# Patient Record
Sex: Male | Born: 1965 | Race: Black or African American | Hispanic: No | Marital: Married | Smoking: Never smoker
Health system: Southern US, Community
[De-identification: ages and names within clinical notes are randomized; demographics above are authoritative.]

## PROBLEM LIST (undated history)

## (undated) DIAGNOSIS — Z923 Personal history of irradiation: Secondary | ICD-10-CM

---

## 2008-01-05 ENCOUNTER — Emergency Department (HOSPITAL_COMMUNITY): Admission: EM | Admit: 2008-01-05 | Discharge: 2008-01-05 | Payer: Self-pay | Admitting: *Deleted

## 2009-06-07 ENCOUNTER — Emergency Department (HOSPITAL_COMMUNITY): Admission: EM | Admit: 2009-06-07 | Discharge: 2009-06-07 | Payer: Self-pay | Admitting: Emergency Medicine

## 2010-07-19 LAB — POCT RAPID STREP A (OFFICE): Streptococcus, Group A Screen (Direct): NEGATIVE

## 2010-12-25 ENCOUNTER — Emergency Department (HOSPITAL_COMMUNITY)
Admission: EM | Admit: 2010-12-25 | Discharge: 2010-12-25 | Disposition: A | Discharge status: Home / Self Care | Payer: Managed Care, Other (non HMO) | Attending: Emergency Medicine | Admitting: Emergency Medicine

## 2010-12-25 DIAGNOSIS — N489 Disorder of penis, unspecified: Secondary | ICD-10-CM | POA: Insufficient documentation

## 2010-12-25 DIAGNOSIS — N4829 Other inflammatory disorders of penis: Secondary | ICD-10-CM | POA: Insufficient documentation

## 2012-06-15 ENCOUNTER — Emergency Department (HOSPITAL_COMMUNITY)
Admission: EM | Admit: 2012-06-15 | Discharge: 2012-06-15 | Disposition: A | Discharge status: Home / Self Care | Payer: Managed Care, Other (non HMO) | Attending: Emergency Medicine | Admitting: Emergency Medicine

## 2012-06-15 ENCOUNTER — Encounter (HOSPITAL_COMMUNITY): Payer: Self-pay | Admitting: Emergency Medicine

## 2012-06-15 DIAGNOSIS — Z79899 Other long term (current) drug therapy: Secondary | ICD-10-CM | POA: Insufficient documentation

## 2012-06-15 DIAGNOSIS — R21 Rash and other nonspecific skin eruption: Secondary | ICD-10-CM | POA: Insufficient documentation

## 2012-06-15 MED ORDER — DIPHENHYDRAMINE HCL 25 MG PO CAPS: 25.0000 mg | ORAL_CAPSULE | Freq: Three times a day (TID) | ORAL | Status: DC | PRN

## 2012-06-15 MED ORDER — DIPHENHYDRAMINE HCL 25 MG PO CAPS
25.0000 mg | ORAL_CAPSULE | Freq: Three times a day (TID) | ORAL | Status: DC | PRN
  Administered 2012-06-15: 25 mg via ORAL
  Filled 2012-06-15: qty 1

## 2012-06-15 MED ORDER — HYDROCORTISONE 1 % EX CREA
TOPICAL_CREAM | Freq: Three times a day (TID) | CUTANEOUS | Status: DC
  Administered 2012-06-15: 03:00:00 via TOPICAL
  Filled 2012-06-15: qty 28

## 2012-06-15 NOTE — ED Notes (Signed)
Patient says he initially had lesion on his right leg, and then now it has spread to both legs, arms and his back.  Patientsays he has not taken anything for it.  He has been able to tolerate it at home, however tonight he could not stand the itching anymore. The lesions appear to be purple/red in color some look like bruises others like raised round papules.  Some are healed and have scabbed over.

## 2012-06-15 NOTE — ED Provider Notes (Signed)
History     CSN: [id number]  Arrival date & time 06/15/12  6578   First MD Initiated Contact with Patient 06/15/12 0257      Chief Complaint  Patient presents with  . Rash    (Consider location/radiation/quality/duration/timing/severity/associated sxs/prior treatment) HPI 47 year old male presents to emergency apartment complaining of a thoracic rash. He reports the rash started as a patch on his right leg, and some spread. He has lesions on his back, torso, legs, arms. Patient presents as he has had itching that he cannot control. Has not taken any medicine prior to arrival. No fevers. No new allergens noted. No prior history of same. History reviewed. No pertinent past medical history.  History reviewed. No pertinent past surgical history.  History reviewed. No pertinent family history.  History  Substance Use Topics  . Smoking status: Never Smoker   . Smokeless tobacco: Not on file  . Alcohol Use: 1.2 oz/week    1 Glasses of wine, 1 Shots of liquor per week      Review of Systems  All other systems reviewed and are negative.    Allergies  Review of patient's allergies indicates no known allergies.  Home Medications   Current Outpatient Rx  Name  Route  Sig  Dispense  Refill  . lisinopril (PRINIVIL,ZESTRIL) 10 MG tablet   Oral   Take 10 mg by mouth daily.         . diphenhydrAMINE (BENADRYL) 25 mg capsule   Oral   Take 1 capsule (25 mg total) by mouth every 8 (eight) hours as needed for itching.   30 capsule   0     BP 126/78  Pulse 63  Temp(Src) 97.9 F (36.6 C) (Oral)  Resp 20  SpO2 100%  Physical Exam  Nursing note and vitals reviewed. Constitutional: He appears well-developed and well-nourished. No distress.  Cardiovascular: Normal rate, regular rhythm, normal heart sounds and intact distal pulses.  Exam reveals no gallop and no friction rub.   No murmur heard. Pulmonary/Chest: Effort normal and breath sounds normal. No respiratory  distress. He has no wheezes. He has no rales. He exhibits no tenderness.  Skin: Skin is warm and dry. He is not diaphoretic. No erythema. No pallor.  Patient with diffuse rash to torso, arms. Lesions are raised papules to plaques. Some have excoriated edges. They are dark in color. They are pruritic. Patient indicates lesion on right upper thigh as the initial presentation. They do not have a characteristic Christmas tree distribution., But overall do appear consistent with pityriasis.  Psychiatric: He has a normal mood and affect. His behavior is normal. Judgment and thought content normal.    ED Course  Procedures (including critical care time)  Labs Reviewed - No data to display No results found.   1. Rash and nonspecific skin eruption       MDM  47 year old male with one week history of a pruritic rash. Symptoms are somewhat similar to pityriasis. Patient given followup to dermatology for further evaluation if it does not clear up in the next week. Will treat with topical hydrocortisone and  Benadryl        Olivia Mackie, MD 06/15/12 [phone]

## 2014-09-03 ENCOUNTER — Encounter (HOSPITAL_COMMUNITY): Payer: Self-pay | Admitting: *Deleted

## 2014-09-03 ENCOUNTER — Emergency Department (HOSPITAL_COMMUNITY)
Admission: EM | Admit: 2014-09-03 | Discharge: 2014-09-03 | Disposition: A | Discharge status: Home / Self Care | Payer: Managed Care, Other (non HMO) | Attending: Emergency Medicine | Admitting: Emergency Medicine

## 2014-09-03 DIAGNOSIS — R000 Tachycardia, unspecified: Secondary | ICD-10-CM | POA: Insufficient documentation

## 2014-09-03 DIAGNOSIS — R590 Localized enlarged lymph nodes: Secondary | ICD-10-CM | POA: Insufficient documentation

## 2014-09-03 DIAGNOSIS — L02415 Cutaneous abscess of right lower limb: Secondary | ICD-10-CM | POA: Insufficient documentation

## 2014-09-03 DIAGNOSIS — L0291 Cutaneous abscess, unspecified: Secondary | ICD-10-CM

## 2014-09-03 DIAGNOSIS — Z79899 Other long term (current) drug therapy: Secondary | ICD-10-CM | POA: Insufficient documentation

## 2014-09-03 MED ORDER — DOXYCYCLINE HYCLATE 100 MG PO CAPS: 100.0000 mg | ORAL_CAPSULE | Freq: Two times a day (BID) | ORAL | Status: DC

## 2014-09-03 MED ORDER — NAPROXEN 500 MG PO TABS: 500.0000 mg | ORAL_TABLET | Freq: Two times a day (BID) | ORAL | Status: DC | PRN

## 2014-09-03 MED ORDER — LIDOCAINE-EPINEPHRINE 2 %-1:100000 IJ SOLN
20.0000 mL | Freq: Once | INTRAMUSCULAR | Status: AC
  Administered 2014-09-03: 1 mL
  Filled 2014-09-03: qty 20

## 2014-09-03 MED ORDER — HYDROCODONE-ACETAMINOPHEN 5-325 MG PO TABS: 1.0000 | ORAL_TABLET | Freq: Four times a day (QID) | ORAL | Status: DC | PRN

## 2014-09-03 NOTE — ED Notes (Signed)
Pt reports abscess to right upper thigh area. Denies fever. No redness or swelling noted to area.

## 2014-09-03 NOTE — ED Notes (Signed)
Pharmacy to send

## 2014-09-03 NOTE — ED Provider Notes (Signed)
CSN: [id number]     Arrival date & time 09/03/14  1411 History  This chart was scribed for non-physician practitioner, Allen DerryMercedes Camprubi-Soms, PA-C, working with Gerhard Munchobert Lockwood, MD, by Ronney LionSuzanne Le, ED Scribe. This patient was seen in room TR11C/TR11C and the patient's care was started at [facility]:24 PM.    Chief Complaint  Patient presents with  . Abscess   Patient is a 49 y.o. male presenting with abscess. The history is provided by the patient. No language interpreter was used.  Abscess Location:  Leg Leg abscess location:  R upper leg Abscess quality: draining, induration, painful and redness   Abscess quality: no warmth   Red streaking: no   Duration:  3 days Progression:  Worsening Pain details:    Quality:  Pressure   Severity:  Moderate (only with movement)   Duration:  3 days   Timing:  Intermittent (only with movement)   Progression:  Unchanged Chronicity:  Recurrent Context: skin injury (ingrown hair)   Context: not diabetes and not immunosuppression   Relieved by:  Warm compresses and draining/squeezing Exacerbated by: movement/brushing against other leg. Ineffective treatments:  None tried Associated symptoms: no fever, no nausea and no vomiting   Risk factors: prior abscess     HPI Comments: Devin Price is a 49 y.o. male with no significant PMHx, who presents to the Emergency Department complaining of an abscess to his right upper inner thigh that he noticed 3 days ago. He complains of 7/10 pressure-like pain to the abscess which radiates to his inguinal area on the R, which is states is intermittent and only occurs only when he is moving due to the thigh rubbing against the other leg; there is no pain with sitting still. Patient has used a warm compress which helped with pain and bringing the area "to a head", and some white purulent material drained from the area and provided some relief. Reports associated swelling and erythema to this area. Patient has had one abscess in  the past. He denies a history of DM. He denies warmth to the area, red streaking, fever, chills, visual disturbances, SOB, chest pain, abdominal pain, nausea, vomiting, diarrhea, hematuria, dysuria, penile discharge, testicular pain or swelling, rectal pain, neck pain, back pain, dizziness, lightheadedness, numbness, tingling, weakness, or bowel or bladder incontinence. He has NKDA. He believes this started after an ingrown hair.   History reviewed. No pertinent past medical history. History reviewed. No pertinent past surgical history. History reviewed. No pertinent family history. History  Substance Use Topics  . Smoking status: Never Smoker   . Smokeless tobacco: Not on file  . Alcohol Use: 1.2 oz/week    1 Glasses of wine, 1 Shots of liquor per week    Review of Systems  Constitutional: Negative for fever and chills.  Respiratory: Negative for shortness of breath.   Cardiovascular: Negative for chest pain.  Gastrointestinal: Negative for nausea, vomiting, abdominal pain, diarrhea and rectal pain.  Genitourinary: Negative for dysuria, hematuria, discharge, scrotal swelling and testicular pain.  Musculoskeletal: Negative for myalgias, back pain, arthralgias and neck pain.  Skin: Positive for color change and wound.       +abscess  Allergic/Immunologic: Negative for immunocompromised state.  Neurological: Negative for weakness and numbness.  10 Systems reviewed and are negative for acute change except as noted in the HPI.     Allergies  Review of patient's allergies indicates no known allergies.  Home Medications   Prior to Admission medications   Medication Sig Start  Date End Date Taking? Authorizing Provider  diphenhydrAMINE (BENADRYL) 25 mg capsule Take 1 capsule (25 mg total) by mouth every 8 (eight) hours as needed for itching. Patient not taking: Reported on 09/03/2014 06/15/12   Marisa Severinlga Otter, MD  lisinopril (PRINIVIL,ZESTRIL) 10 MG tablet Take 10 mg by mouth daily.     Historical Provider, MD   BP 143/86 mmHg  Pulse 102  Temp(Src) 98.5 F (36.9 C) (Oral)  Resp 14  SpO2 98% Physical Exam  Constitutional: He is oriented to person, place, and time. Vital signs are normal. He appears well-developed and well-nourished.  Non-toxic appearance. No distress.  Afebrile, nontoxic, NAD  HENT:  Head: Normocephalic and atraumatic.  Mouth/Throat: Mucous membranes are normal.  Eyes: Conjunctivae and EOM are normal. Right eye exhibits no discharge. Left eye exhibits no discharge.  Neck: Normal range of motion. Neck supple.  Cardiovascular: Intact distal pulses.  Tachycardia present.   Slight tachycardia at triage vitals, resolved on exam.  Pulmonary/Chest: Effort normal. No respiratory distress.  Abdominal: Normal appearance. He exhibits no distension.  Musculoskeletal: Normal range of motion.  Lymphadenopathy:       Right: Inguinal adenopathy present.  Mild right sided inguinal LAD.  Neurological: He is alert and oriented to person, place, and time. He has normal strength. No sensory deficit.  Skin: Skin is warm and dry. No rash noted. There is erythema.  ~6 cm area of induration along the right inner thigh adjacent to inguinal fold with mild erythema, but no red streaking. Mild warmth to the indurated area, but no surrounding cellulitis. Central fluctuance noted at the area of an opening at the center of the abscess, which is draining purulent material very poorly.   Psychiatric: He has a normal mood and affect.  Nursing note and vitals reviewed.   ED Course  INCISION AND DRAINAGE Date/Time: 09/03/2014 3:55 PM Performed by: Allen DerryAMPRUBI-SOMS, Lang Zingg Authorized by: Allen DerryAMPRUBI-SOMS, Ardith Test Consent: Verbal consent obtained. Risks and benefits: risks, benefits and alternatives were discussed Consent given by: patient Patient understanding: patient states understanding of the procedure being performed Patient consent: the patient's understanding of the procedure  matches consent given Patient identity confirmed: verbally with patient Type: abscess Body area: lower extremity Location details: right leg Anesthesia: local infiltration Local anesthetic: lidocaine 2% with epinephrine Anesthetic total: 1 ml Patient sedated: no Scalpel size: 11 Needle gauge: 23. Incision type: single straight Complexity: complex Drainage: purulent Drainage amount: moderate Wound treatment: wound left open Packing material: none Patient tolerance: Patient tolerated the procedure well with no immediate complications   (including critical care time)  DIAGNOSTIC STUDIES: Oxygen Saturation is 98% on RA, normal by my interpretation.    COORDINATION OF CARE: 3:28 PM - Discussed treatment plan with pt at bedside which includes local anesthesia, followed by I&D, and pt agreed to plan. Also will Rx antibiotics and f/u with PCP.    Labs Review Labs Reviewed - No data to display  Imaging Review No results found.   EKG Interpretation None      MDM   Final diagnoses:  Abscess    49 y.o. male here with abscess to R inner thigh, some surrounding induration and erythema, small amount of central fluctuance. Will proceed with I&D and place on abx. Pt driving today therefore declines pain meds now.   4:08 PM I&D with moderate amount of purulent drainage, some loculations which were broken up. Discussed pain meds, abx (doxycycline for this wound since it's very close to anogenital area), and f/up with PCP in  2 days. Discussed if he can't see his PCP, to go to [facility] in 2 days for recheck. Discussed warm compresses and wound care. I explained the diagnosis and have given explicit precautions to return to the ER including for any other new or worsening symptoms. The patient understands and accepts the medical plan as it's been dictated and I have answered their questions. Discharge instructions concerning home care and prescriptions have been given. The patient is STABLE and is  discharged to home in good condition.   I personally performed the services described in this documentation, which was scribed in my presence. The recorded information has been reviewed and is accurate.  BP 143/86 mmHg  Pulse 102  Temp(Src) 98.5 F (36.9 C) (Oral)  Resp 14  SpO2 98%  Meds ordered this encounter  Medications  . lidocaine-EPINEPHrine (XYLOCAINE W/EPI) 2 %-1:100000 (with pres) injection 20 mL    Sig:   . HYDROcodone-acetaminophen (NORCO) 5-325 MG per tablet    Sig: Take 1 tablet by mouth every 6 (six) hours as needed for severe pain.    Dispense:  15 tablet    Refill:  0    Order Specific Question:  Supervising Provider    Answer:  MILLER, BRIAN [[id number]]  . naproxen (NAPROSYN) 500 MG tablet    Sig: Take 1 tablet (500 mg total) by mouth 2 (two) times daily as needed for mild pain, moderate pain or headache (TAKE WITH MEALS.).    Dispense:  20 tablet    Refill:  0    Order Specific Question:  Supervising Provider    Answer:  MILLER, BRIAN [[id number]]  . doxycycline (VIBRAMYCIN) 100 MG capsule    Sig: Take 1 capsule (100 mg total) by mouth 2 (two) times daily. One po bid x 7 days    Dispense:  14 capsule    Refill:  0    Order Specific Question:  Supervising Provider    Answer:  Eber Hong [[id number]]     Roshan Salamon Camprubi-Soms, PA-C 09/03/14 1620  Gerhard Munch, MD 09/04/14 2252

## 2014-09-03 NOTE — Discharge Instructions (Signed)
Keep wound clean and dry. Apply warm compresses to affected area throughout the day. Take antibiotic until it is finished and avoid direct sunlight while taking antibiotic. Take naprosyn and norco as directed, as needed for pain but do not drive or operate machinery with pain medication use. [facility] for wound recheck. Monitor area for signs of infection to include, but not limited to: increasing pain, redness, drainage/pus, or swelling. Return to emergency department for emergent changing or worsening symptoms.    Abscess An abscess (boil or furuncle) is an infected area on or under the skin. This area is filled with yellowish-white fluid (pus) and other material (debris). HOME CARE   Only take medicines as told by your doctor.  If you were given antibiotic medicine, take it as directed. Finish the medicine even if you start to feel better.  If gauze is used, follow your doctor's directions for changing the gauze.  To avoid spreading the infection:  Keep your abscess covered with a bandage.  Wash your hands well.  Do not share personal care items, towels, or whirlpools with others.  Avoid skin contact with others.  Keep your skin and clothes clean around the abscess.  Keep all doctor visits as told. GET HELP RIGHT AWAY IF:   You have more pain, puffiness (swelling), or redness in the wound site.  You have more fluid or blood coming from the wound site.  You have muscle aches, chills, or you feel sick.  You have a fever. MAKE SURE YOU:   Understand these instructions.  Will watch your condition.  Will get help right away if you are not doing well or get worse. Document Released: 10/02/2007 Document Revised: 10/15/2011 Document Reviewed: 06/28/2011 [facility] Patient Information 2015 [place], [place]. This information is not intended to replace advice given to you by your health care provider. Make sure you discuss any  questions you have with your health care provider.  Incision and Drainage Incision and drainage is a procedure in which a sac-like structure (cystic structure) is opened and drained. The area to be drained usually contains material such as pus, fluid, or blood.  LET YOUR CAREGIVER KNOW ABOUT:   Allergies to medicine.  Medicines taken, including vitamins, herbs, eyedrops, over-the-counter medicines, and creams.  Use of steroids (by mouth or creams).  Previous problems with anesthetics or numbing medicines.  History of bleeding problems or blood clots.  Previous surgery.  Other health problems, including diabetes and kidney problems.  Possibility of pregnancy, if this applies. RISKS AND COMPLICATIONS  Pain.  Bleeding.  Scarring.  Infection. BEFORE THE PROCEDURE  You may need to have an ultrasound or other imaging tests to see how large or deep your cystic structure is. Blood tests may also be used to determine if you have an infection or how severe the infection is. You may need to have a tetanus shot. PROCEDURE  The affected area is cleaned with a cleaning fluid. The cyst area will then be numbed with a medicine (local anesthetic). A small incision will be made in the cystic structure. A syringe or catheter may be used to drain the contents of the cystic structure, or the contents may be squeezed out. The area will then be flushed with a cleansing solution. After cleansing the area, it is often gently packed with a gauze or another wound dressing. Once it is packed, it will be covered with gauze and tape or some other type of  wound dressing. AFTER THE PROCEDURE   Often, you will be allowed to go home right after the procedure.  You may be given antibiotic medicine to prevent or heal an infection.  If the area was packed with gauze or some other wound dressing, you will likely need to come back in 1 to 2 days to get it removed.  The area should heal in about 14  days. Document Released: 10/09/2000 Document Revised: 10/15/2011 Document Reviewed: 06/10/2011 [facility] Patient Information 2015 [place], [place]. This information is not intended to replace advice given to you by your health care provider. Make sure you discuss any questions you have with your health care provider.

## 2017-03-19 ENCOUNTER — Encounter (HOSPITAL_COMMUNITY): Payer: Self-pay | Admitting: *Deleted

## 2017-03-19 ENCOUNTER — Other Ambulatory Visit: Payer: Self-pay

## 2017-03-19 ENCOUNTER — Emergency Department (HOSPITAL_COMMUNITY)
Admission: EM | Admit: 2017-03-19 | Discharge: 2017-03-19 | Disposition: A | Discharge status: Home / Self Care | Payer: Managed Care, Other (non HMO) | Attending: Emergency Medicine | Admitting: Emergency Medicine

## 2017-03-19 DIAGNOSIS — L03115 Cellulitis of right lower limb: Secondary | ICD-10-CM | POA: Insufficient documentation

## 2017-03-19 DIAGNOSIS — L03119 Cellulitis of unspecified part of limb: Secondary | ICD-10-CM

## 2017-03-19 DIAGNOSIS — Z79899 Other long term (current) drug therapy: Secondary | ICD-10-CM | POA: Insufficient documentation

## 2017-03-19 MED ORDER — CEPHALEXIN 500 MG PO CAPS: 500.0000 mg | ORAL_CAPSULE | Freq: Four times a day (QID) | ORAL | 0 refills | Status: DC

## 2017-03-19 MED ORDER — ACETAMINOPHEN 500 MG PO TABS
1000.0000 mg | ORAL_TABLET | Freq: Once | ORAL | Status: AC
  Administered 2017-03-19: 1000 mg via ORAL
  Filled 2017-03-19: qty 2

## 2017-03-19 NOTE — Discharge Instructions (Signed)
You can take Tylenol or Ibuprofen as directed for pain. You can alternate Tylenol and Ibuprofen every 4 hours. If you take Tylenol at 1pm, then you can take Ibuprofen at 5pm. Then you can take Tylenol again at 9pm.   Take antibiotics as directed. Please take all of your antibiotics until finished.  Follow-up  with your PCP in 24-48 hours. If you do not have a primary care doctor, [facility]   Return to the Emergency Department for any worsening pain, increasing redness/welling, fevers or any other concerns.

## 2017-03-19 NOTE — ED Triage Notes (Signed)
Pt reports rt foot swelling and pain. Pt states that he pulled a piece of skin off and began having symptoms after that. Pt states that this has been going on since Sunday. Pt noted to have redness, heat and swelling extending from his toes up his foot.

## 2017-03-19 NOTE — ED Provider Notes (Signed)
[facility] EMERGENCY DEPARTMENT Provider Note   CSN: [id number] Arrival date & time: 03/19/17  0357     History   Chief Complaint Chief Complaint  Patient presents with  . Foot Pain    HPI Devin Price is a 51 y.o. male who presents with 3 days of right foot pain, swelling and redness. Patient reports that symptoms began days ago after he scratched his foot and picked off a portion of dead skin that was on the dorsal aspect of his foot.  Reports that since then, he has had redness, warmth and swelling noted to the dorsal aspect of the right foot.  He states that he has been able to ambulate without any difficulty.  Patient reports that he has not taken any medications for the symptoms.  He denies any preceding trauma, injury, fall.  He states that the symptoms have not extended up to the right lower extremity.  He denies any fevers, chest pain, difficulty breathing, calf pain, redness or swelling to his leg  The history is provided by the patient.    History reviewed. No pertinent past medical history.  There are no active problems to display for this patient.   History reviewed. No pertinent surgical history.     Home Medications    Prior to Admission medications   Medication Sig Start Date End Date Taking? Authorizing Provider  cephALEXin (KEFLEX) 500 MG capsule Take 1 capsule (500 mg total) by mouth 4 (four) times daily. 03/19/17   Maxwell Caul, PA-C  diphenhydrAMINE (BENADRYL) 25 mg capsule Take 1 capsule (25 mg total) by mouth every 8 (eight) hours as needed for itching. Patient not taking: Reported on 09/03/2014 06/15/12   Marisa Severin, MD  doxycycline (VIBRAMYCIN) 100 MG capsule Take 1 capsule (100 mg total) by mouth 2 (two) times daily. One po bid x 7 days 09/03/14   Street, Amaya, PA-C  HYDROcodone-acetaminophen (NORCO) 5-325 MG per tablet Take 1 tablet by mouth every 6 (six) hours as needed for severe pain. 09/03/14   Street, Mercedes, PA-C    lisinopril (PRINIVIL,ZESTRIL) 10 MG tablet Take 10 mg by mouth daily.    [provider]  naproxen (NAPROSYN) 500 MG tablet Take 1 tablet (500 mg total) by mouth 2 (two) times daily as needed for mild pain, moderate pain or headache (TAKE WITH MEALS.). 09/03/14   Street, Glidden, PA-C    Family History No family history on file.  Social History Social History   Tobacco Use  . Smoking status: Never Smoker  Substance Use Topics  . Alcohol use: Yes    Alcohol/week: 1.2 oz    Types: 1 Glasses of wine, 1 Shots of liquor per week  . Drug use: No     Allergies   Patient has no known allergies.   Review of Systems Review of Systems  Constitutional: Negative for fever.  Respiratory: Negative for shortness of breath.   Cardiovascular: Negative for chest pain and leg swelling.  Musculoskeletal:       Right foot pain  Skin: Positive for color change.     Physical Exam Updated Vital Signs BP 136/84 Comment: Simultaneous filing. User may not have seen previous data.  Pulse 85 Comment: Simultaneous filing. User may not have seen previous data.  Temp 98.7 F (37.1 C) (Oral)   Resp 17   SpO2 94% Comment: Simultaneous filing. User may not have seen previous data.  Physical Exam  Constitutional: He appears well-developed and well-nourished.  Sitting  comfortably on examination table  HENT:  Head: Normocephalic and atraumatic.  Eyes: Conjunctivae and EOM are normal. Right eye exhibits no discharge. Left eye exhibits no discharge. No scleral icterus.  Cardiovascular:  Pulses:      Dorsalis pedis pulses are 2+ on the right side, and 2+ on the left side.  Pulmonary/Chest: Effort normal.  Musculoskeletal:  No tenderness palpation to the right ankle.  Full range of motion of right ankle without any difficulty.  Mild tenderness palpation overlying the dorsal aspect of the right foot.  Full range of motion of toes of right foot without any difficulty.  Bilateral lower extremities  are symmetric in appearance.  No edema noted to either extremity.  No calf tenderness bilaterally.  Neurological: He is alert.  Skin: Skin is warm and dry. Capillary refill takes less than 2 seconds.  RLE is not dusky in appearance or cool to touch. Right dorsal foot has overlying warmth, erythema and induration that stops just at the ankle. The skin to the dorsal right foot is dry and cracked with evidence of excoriations.  Psychiatric: He has a normal mood and affect. His speech is normal and behavior is normal.  Nursing note and vitals reviewed.    ED Treatments / Results  Labs (all labs ordered are listed, but only abnormal results are displayed) Labs Reviewed - No data to display  EKG  EKG Interpretation None       Radiology No results found.  Procedures Procedures (including critical care time)  Medications Ordered in ED Medications  acetaminophen (TYLENOL) tablet 1,000 mg (1,000 mg Oral Given 03/19/17 0659)     Initial Impression / Assessment and Plan / ED Course  I have reviewed the triage vital signs and the nursing notes.  Pertinent labs & imaging results that were available during my care of the patient were reviewed by me and considered in my medical decision making (see chart for details).     51 year old male who presents with 3 days of right foot pain, redness and swelling.  Patient reports his symptoms began after he pulled some dead skin off of his feet and began itching and scratching his feet.  No fevers.  No redness, swelling noted to the bilateral lower extremities.  No calf tenderness, chest pain, difficulty breathing. Patient is afebrile, non-toxic appearing, sitting comfortably on examination table. Vital signs reviewed and stable.  Physical exam is concerning for cellulitis.  Patient has no history of gout is not on any medications that would put him at risk for gout.  History/physical exam are not concerning for DVT, acute arterial embolism.  Suspect  that this is cellulitis secondary from scratching and pulling skin off. Patient with no known drug allergies. Will plan to start on antibiotics for treatment of cellulitis. Provided patient with a list of clinic resources to use if he does not have a PCP. Instructed to call them today to arrange follow-up in the next 24-48 hours. Patient had ample opportunity for questions and discussion. All patient's questions were answered with full understanding. Strict return precautions discussed. Patient expresses understanding and agreement to plan.    Final Clinical Impressions(s) / ED Diagnoses   Final diagnoses:  Cellulitis of foot    ED Discharge Orders        Ordered    cephALEXin (KEFLEX) 500 MG capsule  4 times daily     03/19/17 0704       Maxwell CaulLayden, Lindsey A, PA-C 03/19/17 1744    Palumbo, April,  MD 03/21/17 [id number]

## 2017-03-19 NOTE — ED Notes (Signed)
Patient ambulated independently to restroom.  Gait steady and even.   

## 2017-03-19 NOTE — ED Notes (Signed)
Patient able to ambulate independently  

## 2017-11-27 ENCOUNTER — Ambulatory Visit: Payer: Self-pay

## 2017-11-27 ENCOUNTER — Ambulatory Visit (INDEPENDENT_AMBULATORY_CARE_PROVIDER_SITE_OTHER): Payer: BLUE CROSS/BLUE SHIELD | Admitting: Podiatry

## 2017-11-27 DIAGNOSIS — L603 Nail dystrophy: Secondary | ICD-10-CM | POA: Diagnosis not present

## 2017-11-27 DIAGNOSIS — M79609 Pain in unspecified limb: Secondary | ICD-10-CM

## 2017-11-27 DIAGNOSIS — B351 Tinea unguium: Secondary | ICD-10-CM

## 2017-11-27 DIAGNOSIS — M2041 Other hammer toe(s) (acquired), right foot: Secondary | ICD-10-CM

## 2017-11-27 DIAGNOSIS — M2042 Other hammer toe(s) (acquired), left foot: Principal | ICD-10-CM

## 2017-11-27 MED ORDER — FLUCONAZOLE 150 MG PO TABS: 150.0000 mg | ORAL_TABLET | ORAL | 1 refills | Status: DC

## 2017-11-27 NOTE — Patient Instructions (Signed)
The day after your procedure:  Place 1/4 cup of epsom salts in a quart of warm tap water.  Submerge your foot or feet in the solution and soak for 20 minutes.  This soak should be done twice a day.  Next, remove your foot or feet from solution, blot dry the affected area. Apply ointment and cover if instructed by your doctor.   IF YOUR SKIN BECOMES IRRITATED WHILE USING THESE INSTRUCTIONS, IT IS OKAY TO SWITCH TO  WHITE VINEGAR AND WATER.  As another alternative soak, you may use antibacterial soap and water.  Monitor for any signs/symptoms of infection. Call the office immediately if any occur or go directly to the emergency room. Call with any questions/concerns.    

## 2017-11-27 NOTE — Progress Notes (Signed)
  Subjective:  Patient ID: Devin Price, male    DOB: 08-Jul-1965,  MRN: [id number]  Chief Complaint  Patient presents with  . Nail Problem    bilateral elongated thick discolored toenails   52 y.o. male presents with the above complaint.  Reports thickened toenails to both feet.  For several years.  States that very unsightly.  Denies pain.  Denies prior treatments.  No past medical history on file. No past surgical history on file.  Current Outpatient Medications:  .  cephALEXin (KEFLEX) 500 MG capsule, Take 1 capsule (500 mg total) by mouth 4 (four) times daily., Disp: 28 capsule, Rfl: 0 .  diphenhydrAMINE (BENADRYL) 25 mg capsule, Take 1 capsule (25 mg total) by mouth every 8 (eight) hours as needed for itching. (Patient not taking: Reported on 09/03/2014), Disp: 30 capsule, Rfl: 0 .  doxycycline (VIBRAMYCIN) 100 MG capsule, Take 1 capsule (100 mg total) by mouth 2 (two) times daily. One po bid x 7 days, Disp: 14 capsule, Rfl: 0 .  fluconazole (DIFLUCAN) 150 MG tablet, Take 1 tablet (150 mg total) by mouth once a week., Disp: 6 tablet, Rfl: 1 .  HYDROcodone-acetaminophen (NORCO) 5-325 MG per tablet, Take 1 tablet by mouth every 6 (six) hours as needed for severe pain., Disp: 15 tablet, Rfl: 0 .  lisinopril (PRINIVIL,ZESTRIL) 10 MG tablet, Take 10 mg by mouth daily., Disp: , Rfl:  .  naproxen (NAPROSYN) 500 MG tablet, Take 1 tablet (500 mg total) by mouth 2 (two) times daily as needed for mild pain, moderate pain or headache (TAKE WITH MEALS.)., Disp: 20 tablet, Rfl: 0  No Known Allergies Review of Systems: Negative except as noted in the HPI. Denies N/V/F/Ch. Objective:  There were no vitals filed for this visit. General AA&O x3. Normal mood and affect.  Vascular Dorsalis pedis and posterior tibial pulses  present 2+ bilaterally  Capillary refill normal to all digits. Pedal hair growth normal.  Neurologic Epicritic sensation grossly present.  Dermatologic No open  lesions. Interspaces clear of maceration. Hallux nail dystrophy right great toe. Other toes dystrophic, thickened, black discoloration, transverse ridging.  Orthopedic: MMT 5/5 in dorsiflexion, plantarflexion, inversion, and eversion. Normal joint ROM without pain or crepitus.   Assessment & Plan:  Patient was evaluated and treated and all questions answered.  Onychomycosis -Educated on etiology of nail fungus. -Nail sample taken for microbiology and histology. -Nail avulsed as below.  Lesser nails debrided courtesy -Discussed pharmacologic management with patient.  Will try weekly for econazole until nail culture returns.  Advised the risk and benefits of the medication.  Advised appropriate use the medication  Procedure: Avulsion of Toenail Location: Right 1st toe  Anesthesia: Lidocaine 1% plain; 1.545mL and Marcaine 0.5% plain; 1.435mL, digital block. Skin Prep: Betadine. Dressing: Silvadene; telfa; dry, sterile, compression dressing. Technique: Following skin prep, the toe was exsanguinated and a tourniquet was secured at the base of the toe. The affected nail border was freed, split with a nail splitter, and excised. Chemical matrixectomy was then performed with phenol and irrigated out with alcohol. The tourniquet was then removed and sterile dressing applied. Disposition: Patient tolerated procedure well. Patient to return in 2 weeks for follow-up.   Return in about 5 weeks (around 01/01/2018) for Nail Fungus.

## 2018-01-01 ENCOUNTER — Ambulatory Visit (INDEPENDENT_AMBULATORY_CARE_PROVIDER_SITE_OTHER): Payer: BLUE CROSS/BLUE SHIELD | Admitting: Podiatry

## 2018-01-01 DIAGNOSIS — B351 Tinea unguium: Secondary | ICD-10-CM

## 2018-01-01 DIAGNOSIS — M79609 Pain in unspecified limb: Secondary | ICD-10-CM

## 2018-01-01 DIAGNOSIS — L603 Nail dystrophy: Secondary | ICD-10-CM

## 2018-01-01 MED ORDER — FLUCONAZOLE 150 MG PO TABS: 150.0000 mg | ORAL_TABLET | ORAL | 1 refills | Status: DC

## 2018-01-01 NOTE — Progress Notes (Signed)
  Subjective:  Patient ID: Devin Price, male    DOB: 1965-05-18,  MRN: [id number]  Chief Complaint  Patient presents with  . Nail Problem     5WK FOLLOW UP NAIL FUGUS    52 y.o. male presents with the above complaint. States the nails are doing better.  Review of Systems: Negative except as noted in the HPI. Denies N/V/F/Ch.  No past medical history on file.  Current Outpatient Medications:  .  cephALEXin (KEFLEX) 500 MG capsule, Take 1 capsule (500 mg total) by mouth 4 (four) times daily., Disp: 28 capsule, Rfl: 0 .  diphenhydrAMINE (BENADRYL) 25 mg capsule, Take 1 capsule (25 mg total) by mouth every 8 (eight) hours as needed for itching. (Patient not taking: Reported on 09/03/2014), Disp: 30 capsule, Rfl: 0 .  doxycycline (VIBRAMYCIN) 100 MG capsule, Take 1 capsule (100 mg total) by mouth 2 (two) times daily. One po bid x 7 days, Disp: 14 capsule, Rfl: 0 .  fluconazole (DIFLUCAN) 150 MG tablet, Take 1 tablet (150 mg total) by mouth once a week., Disp: 6 tablet, Rfl: 1 .  HYDROcodone-acetaminophen (NORCO) 5-325 MG per tablet, Take 1 tablet by mouth every 6 (six) hours as needed for severe pain., Disp: 15 tablet, Rfl: 0 .  lisinopril (PRINIVIL,ZESTRIL) 10 MG tablet, Take 10 mg by mouth daily., Disp: , Rfl:  .  naproxen (NAPROSYN) 500 MG tablet, Take 1 tablet (500 mg total) by mouth 2 (two) times daily as needed for mild pain, moderate pain or headache (TAKE WITH MEALS.)., Disp: 20 tablet, Rfl: 0  Social History   Tobacco Use  Smoking Status Never Smoker    No Known Allergies Objective:  There were no vitals filed for this visit. There is no height or weight on file to calculate BMI. Constitutional Well developed. Well nourished.  Vascular Dorsalis pedis pulses palpable bilaterally. Posterior tibial pulses palpable bilaterally. Capillary refill normal to all digits.  No cyanosis or clubbing noted. Pedal hair growth normal.  Neurologic Normal speech. Oriented to person,  place, and time. Epicritic sensation to light touch grossly present bilaterally.  Dermatologic Nails elongated and thickened with some proximal clearing. No open wounds. No skin lesions.  Orthopedic: Normal joint ROM without pain or crepitus bilaterally. No visible deformities. No bony tenderness.   Radiographs: None Assessment:   1. Onychomycosis of great toe   2. Nail dystrophy   3. Pain due to onychomycosis of nail    Plan:  Patient was evaluated and treated and all questions answered.  Onychomycosis -Improving. Refill fluconazole. Advised to monitor for side effects. -Nails gently courtesy debrided. -Discussed waiting for nails to fully grow out for complete resolutio.  Return in about 6 weeks (around 02/12/2018) for nail fungus.

## 2018-02-12 ENCOUNTER — Ambulatory Visit (INDEPENDENT_AMBULATORY_CARE_PROVIDER_SITE_OTHER): Payer: BLUE CROSS/BLUE SHIELD | Admitting: Podiatry

## 2018-02-12 DIAGNOSIS — L603 Nail dystrophy: Secondary | ICD-10-CM | POA: Diagnosis not present

## 2018-02-12 DIAGNOSIS — B351 Tinea unguium: Secondary | ICD-10-CM | POA: Diagnosis not present

## 2018-02-12 MED ORDER — FLUCONAZOLE 150 MG PO TABS: 150.0000 mg | ORAL_TABLET | ORAL | 0 refills | Status: DC

## 2018-05-24 NOTE — Progress Notes (Signed)
  Subjective:  Patient ID: Devin Price, male    DOB: 07-29-1965,  MRN: [id number]  Chief Complaint  Patient presents with  . Nail Problem    f/u on nail fungus. Pt is getting better since his last visit    53 y.o. male presents with the above complaint. States the nails are doing better.  Review of Systems: Negative except as noted in the HPI. Denies N/V/F/Ch.  No past medical history on file.  Current Outpatient Medications:  .  cephALEXin (KEFLEX) 500 MG capsule, Take 1 capsule (500 mg total) by mouth 4 (four) times daily., Disp: 28 capsule, Rfl: 0 .  diphenhydrAMINE (BENADRYL) 25 mg capsule, Take 1 capsule (25 mg total) by mouth every 8 (eight) hours as needed for itching., Disp: 30 capsule, Rfl: 0 .  doxycycline (VIBRAMYCIN) 100 MG capsule, Take 1 capsule (100 mg total) by mouth 2 (two) times daily. One po bid x 7 days, Disp: 14 capsule, Rfl: 0 .  fluconazole (DIFLUCAN) 150 MG tablet, Take 1 tablet (150 mg total) by mouth once a week., Disp: 6 tablet, Rfl: 0 .  HYDROcodone-acetaminophen (NORCO) 5-325 MG per tablet, Take 1 tablet by mouth every 6 (six) hours as needed for severe pain., Disp: 15 tablet, Rfl: 0 .  lisinopril (PRINIVIL,ZESTRIL) 10 MG tablet, Take 10 mg by mouth daily., Disp: , Rfl:  .  naproxen (NAPROSYN) 500 MG tablet, Take 1 tablet (500 mg total) by mouth 2 (two) times daily as needed for mild pain, moderate pain or headache (TAKE WITH MEALS.)., Disp: 20 tablet, Rfl: 0  Social History   Tobacco Use  Smoking Status Never Smoker    No Known Allergies Objective:  There were no vitals filed for this visit. There is no height or weight on file to calculate BMI. Constitutional Well developed. Well nourished.  Vascular Dorsalis pedis pulses palpable bilaterally. Posterior tibial pulses palpable bilaterally. Capillary refill normal to all digits.  No cyanosis or clubbing noted. Pedal hair growth normal.  Neurologic Normal speech. Oriented to person, place, and  time. Epicritic sensation to light touch grossly present bilaterally.  Dermatologic Nails elongated and thickened with some proximal clearing. No open wounds. No skin lesions.  Orthopedic: Normal joint ROM without pain or crepitus bilaterally. No visible deformities. No bony tenderness.   Radiographs: None Assessment:   1. Onychomycosis of great toe   2. Nail dystrophy    Plan:  Patient was evaluated and treated and all questions answered.  Onychomycosis -Improving. Refill fluconazole once more. -Discussed no need for liver monitoring with fluconazole. -F/u should he not be happy with results.  Return if symptoms worsen or fail to improve.

## 2018-11-12 ENCOUNTER — Other Ambulatory Visit: Payer: Self-pay | Admitting: *Deleted

## 2018-11-12 DIAGNOSIS — Z20822 Contact with and (suspected) exposure to covid-19: Secondary | ICD-10-CM

## 2018-11-17 LAB — NOVEL CORONAVIRUS, NAA: SARS-CoV-2, NAA: NOT DETECTED

## 2019-11-07 ENCOUNTER — Emergency Department (HOSPITAL_COMMUNITY): Payer: No Typology Code available for payment source

## 2019-11-07 ENCOUNTER — Emergency Department (HOSPITAL_COMMUNITY)
Admission: EM | Admit: 2019-11-07 | Discharge: 2019-11-07 | Disposition: A | Discharge status: Home / Self Care | Payer: No Typology Code available for payment source | Attending: Emergency Medicine | Admitting: Emergency Medicine

## 2019-11-07 DIAGNOSIS — Y999 Unspecified external cause status: Secondary | ICD-10-CM | POA: Insufficient documentation

## 2019-11-07 DIAGNOSIS — Y939 Activity, unspecified: Secondary | ICD-10-CM | POA: Insufficient documentation

## 2019-11-07 DIAGNOSIS — S0081XA Abrasion of other part of head, initial encounter: Secondary | ICD-10-CM | POA: Insufficient documentation

## 2019-11-07 DIAGNOSIS — Z79899 Other long term (current) drug therapy: Secondary | ICD-10-CM | POA: Insufficient documentation

## 2019-11-07 DIAGNOSIS — S61411A Laceration without foreign body of right hand, initial encounter: Secondary | ICD-10-CM

## 2019-11-07 DIAGNOSIS — Y929 Unspecified place or not applicable: Secondary | ICD-10-CM | POA: Insufficient documentation

## 2019-11-07 DIAGNOSIS — S301XXA Contusion of abdominal wall, initial encounter: Secondary | ICD-10-CM | POA: Diagnosis not present

## 2019-11-07 DIAGNOSIS — I10 Essential (primary) hypertension: Secondary | ICD-10-CM | POA: Insufficient documentation

## 2019-11-07 DIAGNOSIS — S0990XA Unspecified injury of head, initial encounter: Secondary | ICD-10-CM

## 2019-11-07 DIAGNOSIS — S20219A Contusion of unspecified front wall of thorax, initial encounter: Secondary | ICD-10-CM | POA: Insufficient documentation

## 2019-11-07 DIAGNOSIS — S161XXA Strain of muscle, fascia and tendon at neck level, initial encounter: Secondary | ICD-10-CM | POA: Insufficient documentation

## 2019-11-07 DIAGNOSIS — S60921A Unspecified superficial injury of right hand, initial encounter: Secondary | ICD-10-CM | POA: Diagnosis present

## 2019-11-07 LAB — BASIC METABOLIC PANEL
Anion gap: 16 — ABNORMAL HIGH (ref 5–15)
BUN: 15 mg/dL (ref 6–20)
CO2: 17 mmol/L — ABNORMAL LOW (ref 22–32)
Calcium: 8.9 mg/dL (ref 8.9–10.3)
Chloride: 106 mmol/L (ref 98–111)
Creatinine, Ser: 1.33 mg/dL — ABNORMAL HIGH (ref 0.61–1.24)
GFR calc Af Amer: >60 mL/min (ref >60)
GFR calc non Af Amer: >60 mL/min (ref >60)
Glucose, Bld: 117 mg/dL — ABNORMAL HIGH (ref 70–99)
Potassium: 3.2 mmol/L — ABNORMAL LOW (ref 3.5–5.1)
Sodium: 139 mmol/L (ref 135–145)

## 2019-11-07 LAB — CBC WITH DIFFERENTIAL/PLATELET
Abs Immature Granulocytes: 0.03 10*3/uL (ref 0.00–0.07)
Basophils Absolute: 0.1 10*3/uL (ref 0.0–0.1)
Basophils Relative: 0 %
Eosinophils Absolute: 0.1 10*3/uL (ref 0.0–0.5)
Eosinophils Relative: 1 %
HCT: 44.5 % (ref 39.0–52.0)
Hemoglobin: 15 g/dL (ref 13.0–17.0)
Immature Granulocytes: 0 %
Lymphocytes Relative: 47 %
Lymphs Abs: 5.6 10*3/uL — ABNORMAL HIGH (ref 0.7–4.0)
MCH: 31.1 pg (ref 26.0–34.0)
MCHC: 33.7 g/dL (ref 30.0–36.0)
MCV: 92.1 fL (ref 80.0–100.0)
Monocytes Absolute: 1 10*3/uL (ref 0.1–1.0)
Monocytes Relative: 8 %
Neutro Abs: 5.4 10*3/uL (ref 1.7–7.7)
Neutrophils Relative %: 44 %
Platelets: 226 10*3/uL (ref 150–400)
RBC: 4.83 MIL/uL (ref 4.22–5.81)
RDW: 13.1 % (ref 11.5–15.5)
WBC: 12.1 10*3/uL — ABNORMAL HIGH (ref 4.0–10.5)
nRBC: 0 % (ref 0.0–0.2)

## 2019-11-07 LAB — URINALYSIS, ROUTINE W REFLEX MICROSCOPIC
Bilirubin Urine: NEGATIVE
Glucose, UA: NEGATIVE mg/dL
Ketones, ur: NEGATIVE mg/dL
Leukocytes,Ua: NEGATIVE
Nitrite: NEGATIVE
Protein, ur: 30 mg/dL — AB
Specific Gravity, Urine: 1.006 (ref 1.005–1.030)
pH: 5 (ref 5.0–8.0)

## 2019-11-07 LAB — TYPE AND SCREEN
ABO/RH(D): A POS
Antibody Screen: NEGATIVE

## 2019-11-07 LAB — ETHANOL: Alcohol, Ethyl (B): 171 mg/dL — ABNORMAL HIGH (ref <10)

## 2019-11-07 MED ORDER — SODIUM CHLORIDE 0.9 % IV BOLUS
1000.0000 mL | Freq: Once | INTRAVENOUS | Status: AC
  Administered 2019-11-07: 1000 mL via INTRAVENOUS

## 2019-11-07 MED ORDER — HYDROCODONE-ACETAMINOPHEN 5-325 MG PO TABS: 1.0000 | ORAL_TABLET | Freq: Four times a day (QID) | ORAL | 0 refills | Status: AC | PRN

## 2019-11-07 MED ORDER — CEFAZOLIN SODIUM-DEXTROSE 2-4 GM/100ML-% IV SOLN
2.0000 g | Freq: Once | INTRAVENOUS | Status: AC
  Administered 2019-11-07: 2 g via INTRAVENOUS
  Filled 2019-11-07: qty 100

## 2019-11-07 MED ORDER — MORPHINE SULFATE (PF) 4 MG/ML IV SOLN
4.0000 mg | Freq: Once | INTRAVENOUS | Status: AC
  Administered 2019-11-07: 4 mg via INTRAVENOUS
  Filled 2019-11-07: qty 1

## 2019-11-07 MED ORDER — AMOXICILLIN-POT CLAVULANATE 875-125 MG PO TABS: 1.0000 | ORAL_TABLET | Freq: Two times a day (BID) | ORAL | 0 refills | Status: AC

## 2019-11-07 MED ORDER — LIDOCAINE HCL 0.5 % IJ SOLN
40.0000 mL | Freq: Once | INTRAMUSCULAR | Status: DC
  Filled 2019-11-07: qty 40

## 2019-11-07 MED ORDER — IBUPROFEN 200 MG PO TABS
600.0000 mg | ORAL_TABLET | Freq: Four times a day (QID) | ORAL | 0 refills | Status: AC
Start: 2019-11-07 — End: 2019-11-22

## 2019-11-07 MED ORDER — IOHEXOL 300 MG/ML  SOLN
100.0000 mL | Freq: Once | INTRAMUSCULAR | Status: AC | PRN
  Administered 2019-11-07: 100 mL via INTRAVENOUS

## 2019-11-07 MED ORDER — GENTAMICIN SULFATE 40 MG/ML IJ SOLN
2.0000 mg/kg | Freq: Once | INTRAVENOUS | Status: AC
  Administered 2019-11-07: 180 mg via INTRAVENOUS
  Filled 2019-11-07: qty 4.5

## 2019-11-07 NOTE — ED Notes (Signed)
Patient verbalizes understanding of discharge instructions. Opportunity for questioning and answers were provided. Armband removed by staff, pt discharged from ED.  
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
## 2019-11-07 NOTE — Discharge Instructions (Addendum)
RIGHT HAND CARE  MOVE THE FINGERS AS MUCH AS IS POSSIBLE IN THE BANDAGE TO HELP PREVENT STIFFNESS KEEP THE BANDAGE ON, CLEAN AND DRY UNTIL YOUR FIRST FOLLOW UP APPOINTMENT  **MOST IMPORTANT--START THEM RIGHT AWAY & TAKE THE ANTIBIOTICS AS DIRECTED UNTIL THEY ARE GONE
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
## 2019-11-07 NOTE — ED Notes (Signed)
Pt attempted to ambulate and stated he felt dizzy upon standing.

## 2019-11-07 NOTE — ED Notes (Signed)
Pt ambulated to RR without incident or complaint of dizziness. EDP notifed.
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
## 2020-04-29 HISTORY — PX: FINGER TENDON REPAIR: SHX1640
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
## 2023-04-08 ENCOUNTER — Other Ambulatory Visit: Payer: Self-pay

## 2023-04-08 ENCOUNTER — Emergency Department (HOSPITAL_BASED_OUTPATIENT_CLINIC_OR_DEPARTMENT_OTHER): Payer: Managed Care, Other (non HMO) | Admitting: Radiology

## 2023-04-08 ENCOUNTER — Encounter (HOSPITAL_BASED_OUTPATIENT_CLINIC_OR_DEPARTMENT_OTHER): Payer: Self-pay | Admitting: Emergency Medicine

## 2023-04-08 ENCOUNTER — Emergency Department (HOSPITAL_BASED_OUTPATIENT_CLINIC_OR_DEPARTMENT_OTHER)
Admission: EM | Admit: 2023-04-08 | Discharge: 2023-04-08 | Disposition: A | Discharge status: Home / Self Care | Payer: Self-pay | Attending: Emergency Medicine | Admitting: Emergency Medicine

## 2023-04-08 DIAGNOSIS — Y92015 Private garage of single-family (private) house as the place of occurrence of the external cause: Secondary | ICD-10-CM | POA: Insufficient documentation

## 2023-04-08 DIAGNOSIS — S62646A Nondisplaced fracture of proximal phalanx of right little finger, initial encounter for closed fracture: Secondary | ICD-10-CM

## 2023-04-08 DIAGNOSIS — X58XXXA Exposure to other specified factors, initial encounter: Secondary | ICD-10-CM | POA: Insufficient documentation

## 2023-04-08 MED ORDER — NAPROXEN 500 MG PO TABS: 500.0000 mg | ORAL_TABLET | Freq: Two times a day (BID) | ORAL | 0 refills | Status: AC

## 2023-04-08 MED ORDER — NAPROXEN 250 MG PO TABS
500.0000 mg | ORAL_TABLET | Freq: Once | ORAL | Status: AC
  Administered 2023-04-08: 500 mg via ORAL
  Filled 2023-04-08: qty 2

## 2023-04-08 NOTE — Discharge Instructions (Signed)
You were evaluated in the Emergency Department and after careful evaluation, we did not find any emergent condition requiring admission or further testing in the hospital.  Your exam/testing today is overall reassuring.  X-ray revealed a small fracture or broken bone in your finger.  Recommend follow-up with your hand specialist for further management.  Use the Naprosyn twice daily for pain.  Please return to the Emergency Department if you experience any worsening of your condition.   Thank you for allowing [place] to be a part of your care.

## 2023-04-08 NOTE — ED Provider Notes (Signed)
[facility] EMERGENCY [facility] Emergency Department Provider Note MRN:  [id number]  Arrival date & time: 04/08/23     Chief Complaint   Hand Injury   History of Present Illness   Devin Price is a 57 y.o. year-old male with no pertinent past medical history presenting to the ED with chief complaint of hand injury.  Stumbled in the garage and fell onto the ulnar side of his hand to break his fall.  Having persistent pain to this right pinky finger ever since, denies head trauma, no other injuries.  Review of Systems  A thorough review of systems was obtained and all systems are negative except as noted in the HPI and PMH.   Patient's Health History   History reviewed. No pertinent past medical history.  History reviewed. No pertinent surgical history.  History reviewed. No pertinent family history.  Social History   Socioeconomic History   Marital status: Married    Spouse name: Not on file   Number of children: Not on file   Years of education: Not on file   Highest education level: Not on file  Occupational History   Not on file  Tobacco Use   Smoking status: Never   Smokeless tobacco: Not on file  Substance and Sexual Activity   Alcohol use: Yes    Alcohol/week: 2.0 standard drinks of alcohol    Types: 1 Glasses of wine, 1 Shots of liquor per week   Drug use: No   Sexual activity: Not on file  Other Topics Concern   Not on file  Social History Narrative   Not on file   Social Determinants of Health   Financial Resource Strain: Not on file  Food Insecurity: No Food Insecurity (08/03/2020)   Received from [facility]   Hunger Vital Sign    Worried About Running Out of Food in the Last Year: Never true    Ran Out of Food in the Last Year: Never true  Transportation Needs: Not on file  Physical Activity: Not on file  Stress: Not on file  Social Connections: Unknown (09/11/2021)   Received from [facility]   Social Network    Social Network: Not on  file  Intimate Partner Violence: Unknown (08/03/2021)   Received from [facility]   HITS    Physically Hurt: Not on file    Insult or Talk Down To: Not on file    Threaten Physical Harm: Not on file    Scream or Curse: Not on file     Physical Exam   Vitals:   04/08/23 0158  BP: (!) 150/101  Pulse: 90  Resp: 20  Temp: 98.2 F (36.8 C)  SpO2: 98%    CONSTITUTIONAL: Well-appearing, NAD NEURO/PSYCH:  Alert and oriented x 3, no focal deficits EYES:  eyes equal and reactive ENT/NECK:  no LAD, no JVD CARDIO: Regular rate, well-perfused, normal S1 and S2 PULM:  CTAB no wheezing or rhonchi GI/GU:  non-distended, non-tender MSK/SPINE:  No gross deformities, no edema SKIN:  no rash, atraumatic   *Additional and/or pertinent findings included in MDM below  Diagnostic and Interventional Summary    EKG Interpretation Date/Time:    Ventricular Rate:    PR Interval:    QRS Duration:    QT Interval:    QTC Calculation:   R Axis:      Text Interpretation:         Labs Reviewed - No data to display  DG Hand Complete Right  Final Result      Medications  naproxen (NAPROSYN) tablet 500 mg (500 mg Oral Given 04/08/23 0236)     Procedures  /  Critical Care Procedures  ED Course and Medical Decision Making  Initial Impression and Ddx Patient has a chronically deformed right pinky finger that is flexed at the PIP.  This has been present for several years.  His pain is at the base of the finger.  Neurovascularly intact awaiting imaging  Past medical/surgical history that increases complexity of ED encounter: None  Interpretation of Diagnostics I personally reviewed the hand x-ray and my interpretation is as follows: Fracture noted    Patient Reassessment and Ultimate Disposition/Management     Discharge  Patient management required discussion with the following services or consulting groups:  None  Complexity of Problems Addressed Acute complicated illness or  Injury  Additional Data Reviewed and Analyzed Further history obtained from: None  Additional Factors Impacting ED Encounter Risk Prescriptions  Elmer Sow. Pilar Plate, MD [facility] Emergency Medicine [facility] [email or web address]  Final Clinical Impressions(s) / ED Diagnoses     ICD-10-CM   1. Closed nondisplaced fracture of proximal phalanx of right little finger, initial encounter  Z61.096E       ED Discharge Orders          Ordered    naproxen (NAPROSYN) 500 MG tablet  2 times daily        04/08/23 0323             Discharge Instructions Discussed with and Provided to Patient:    Discharge Instructions      You were evaluated in the Emergency Department and after careful evaluation, we did not find any emergent condition requiring admission or further testing in the hospital.  Your exam/testing today is overall reassuring.  X-ray revealed a small fracture or broken bone in your finger.  Recommend follow-up with your hand specialist for further management.  Use the Naprosyn twice daily for pain.  Please return to the Emergency Department if you experience any worsening of your condition.   Thank you for allowing [place] to be a part of your care.      Sabas Sous, MD 04/08/23 [phone]

## 2023-04-08 NOTE — ED Triage Notes (Signed)
Patient presents with right hand pain s/p fall yesterday. States "tried to catch myself and hurt my hand".

## 2023-05-27 ENCOUNTER — Telehealth: Payer: Self-pay | Admitting: Plastic Surgery

## 2023-05-27 NOTE — Telephone Encounter (Signed)
Called pt bc we still do not have the [place] referral, he said he would call and find out why, just an update

## 2023-05-28 ENCOUNTER — Institutional Professional Consult (permissible substitution): Payer: Self-pay | Admitting: Plastic Surgery

## 2023-08-06 ENCOUNTER — Telehealth: Payer: Self-pay | Admitting: Radiation Oncology

## 2023-08-06 NOTE — Telephone Encounter (Signed)
 Called patient to schedule a consultation w. Dr. Mitzi Hansen. No answer, LVM for a return call.

## 2023-08-12 NOTE — Progress Notes (Signed)
 Radiation Oncology         [phone] ________________________________  Initial {In/Out}patient Consultation  Name: Devin Price MRN: [id number]  Date: 08/13/2023  DOB: 28-Mar-1966  WU:[id number], No Pcp Per  Sherie Dine, MD   REFERRING PHYSICIAN: Sherie Dine, MD  DIAGNOSIS: There were no encounter diagnoses.  Recurrent left ear keloid   HISTORY OF PRESENT ILLNESS::Devin Price is a 58 y.o. male who is accompanied by ***. he is seen as a courtesy of Dr. Richard Champion for an opinion concerning radiation therapy as part of management for his recurrent keloid.   The patient presented to his dermatologist with complains of recurrent left ear keloid scar present for 25+ years most likely the result of a piercing he had when he was in the [employer]. He had previously shaved it twice and had several steroid injections with minimum response. Patient then was referred to plastic surgery on 07/23/23 for excision.    Dr. Vallie Gay explained that patient will have to undergo radiation treatment following his excision to minimize recurrence. Patient will decide on treatment plan after radiation consultation.   PREVIOUS RADIATION THERAPY: No  PAST MEDICAL HISTORY: No past medical history on file.  PAST SURGICAL HISTORY:No past surgical history on file.  FAMILY HISTORY: No family history on file.  SOCIAL HISTORY:  Social History   Tobacco Use   Smoking status: Never  Substance Use Topics   Alcohol use: Yes    Alcohol/week: 2.0 standard drinks of alcohol    Types: 1 Glasses of wine, 1 Shots of liquor per week   Drug use: No    ALLERGIES: No Known Allergies  MEDICATIONS:  Current Outpatient Medications  Medication Sig Dispense Refill   HYDROcodone-acetaminophen (NORCO) 5-325 MG tablet Take 1-2 tablets by mouth every 6 (six) hours as needed. 20 tablet 0   naproxen (NAPROSYN) 500 MG tablet Take 1 tablet (500 mg total) by mouth 2 (two) times daily. 30 tablet 0   No current  facility-administered medications for this encounter.    REVIEW OF SYSTEMS:  A 10+ POINT REVIEW OF SYSTEMS WAS OBTAINED including neurology, dermatology, psychiatry, cardiac, respiratory, lymph, extremities, GI, GU, musculoskeletal, constitutional, reproductive, HEENT. ***   PHYSICAL EXAM:  vitals were not taken for this visit.   General: Alert and oriented, in no acute distress HEENT: Head is normocephalic. Extraocular movements are intact. Oropharynx is clear. Neck: Neck is supple, no palpable cervical or supraclavicular lymphadenopathy. Heart: Regular in rate and rhythm with no murmurs, rubs, or gallops. Chest: Clear to auscultation bilaterally, with no rhonchi, wheezes, or rales. Abdomen: Soft, nontender, nondistended, with no rigidity or guarding. Extremities: No cyanosis or edema. Lymphatics: see Neck Exam Skin: No concerning lesions. Musculoskeletal: symmetric strength and muscle tone throughout. Neurologic: Cranial nerves II through XII are grossly intact. No obvious focalities. Speech is fluent. Coordination is intact. Psychiatric: Judgment and insight are intact. Affect is appropriate. ***  ECOG = ***  0 - Asymptomatic (Fully active, able to carry on all predisease activities without restriction)  1 - Symptomatic but completely ambulatory (Restricted in physically strenuous activity but ambulatory and able to carry out work of a light or sedentary nature. For example, light housework, office work)  2 - Symptomatic, <50% in bed during the day (Ambulatory and capable of all self care but unable to carry out any work activities. Up and about more than 50% of waking hours)  3 - Symptomatic, >50% in bed, but not bedbound (Capable of only limited self-care,  confined to bed or chair 50% or more of waking hours)  4 - Bedbound (Completely disabled. Cannot carry on any self-care. Totally confined to bed or chair)  5 - Death   Aurea Blossom MM, Creech RH, Tormey DC, et al. [phone]). "Toxicity  and response criteria of the [facility]". Am. [employer]ard [employer]. Oncol. 5 (6): 649-55  LABORATORY DATA:  Lab Results  Component Value Date   WBC 12.1 (H) 11/07/2019   HGB 15.0 11/07/2019   HCT 44.5 11/07/2019   MCV 92.1 11/07/2019   PLT 226 11/07/2019   NEUTROABS 5.4 11/07/2019   Lab Results  Component Value Date   NA 139 11/07/2019   K 3.2 (L) 11/07/2019   CL 106 11/07/2019   CO2 17 (L) 11/07/2019   GLUCOSE 117 (H) 11/07/2019   BUN 15 11/07/2019   CREATININE 1.33 (H) 11/07/2019   CALCIUM 8.9 11/07/2019      RADIOGRAPHY: No results found.    IMPRESSION: Recurrent left ear keloid   ***  Today, I talked to the patient and family about the findings and work-up thus far.  We discussed the natural history of *** and general treatment, highlighting the role of radiotherapy in the management.  We discussed the available radiation techniques, and focused on the details of logistics and delivery.  We reviewed the anticipated acute and late sequelae associated with radiation in this setting.  The patient was encouraged to ask questions that I answered to the best of my ability. *** A patient consent form was discussed and signed.  We retained a copy for our records.  The patient would like to proceed with radiation and will be scheduled for CT simulation.  PLAN: ***    *** minutes of total time was spent for this patient encounter, including preparation, face-to-face counseling with the patient and coordination of care, physical exam, and documentation of the encounter.   ------------------------------------------------  Noralee Beam, PhD, MD  This document serves as a record of services personally performed by Retta Caster, MD. It was created on his behalf by Lucky Sable, a trained medical scribe. The creation of this record is based on the scribe's personal observations and the provider's statements to them. This document has been checked and approved by the  attending provider.

## 2023-08-13 ENCOUNTER — Encounter: Payer: Self-pay | Admitting: Radiation Oncology

## 2023-08-13 ENCOUNTER — Ambulatory Visit
Admission: RE | Admit: 2023-08-13 | Discharge: 2023-08-13 | Disposition: A | Discharge status: Home / Self Care | Payer: Self-pay | Source: Ambulatory Visit | Attending: Radiation Oncology | Admitting: Radiation Oncology

## 2023-08-13 VITALS — BP 144/105 | HR 77 | Temp 97.9°F | Resp 18 | Ht 67.0 in | Wt 224.6 lb

## 2023-08-13 DIAGNOSIS — L910 Hypertrophic scar: Secondary | ICD-10-CM | POA: Insufficient documentation

## 2023-08-13 DIAGNOSIS — Z79899 Other long term (current) drug therapy: Secondary | ICD-10-CM | POA: Insufficient documentation

## 2023-08-13 DIAGNOSIS — Z791 Long term (current) use of non-steroidal anti-inflammatories (NSAID): Secondary | ICD-10-CM | POA: Insufficient documentation

## 2023-08-13 NOTE — Progress Notes (Addendum)
 Location of Concern: Left Earlobe  Patient presented with the following signs/symptoms, 3 months ago:  Devin Price, Devin Price is a 58 year old, MALE with a PMH as listed  below referred by community care dermatology for treatment of recurrent left ear  keloid scar present for 25+ years. Patient had a piercing there in the  [employer]. Outside dermatology has had it shaved it off twice and he has had numerous steroid injections. Community care dermatology referred for surgery as he is not responded to steroid injection. He has had tenderness since the  mostrecent attempt at injection. He reports itching too. No other keloids.  Past/Anticipated interventions by patient's surgeon/dermatologist for current problematic lesion, if any:    Past Keloids, if any: 1) Location/Histology/Intervention: Left Earlobe/ None/Radiation treatment   Surgery Interventions: PLAN: Excision keloid scar left earlobe under local anesthetic either here at  the [facility] or the community coordinated with postoperative radiation therapy.  Excision and radiation in addition to continued Kenalog injection with earlobe  pressure dressing offers the lowest chance of recurrence. Patient will pursue a radiation therapy consultation and decide how he would like to proceed. All questions were answered.  PLASTIC SURGERY NURSE TO COORDINATE: Community care referral for radiation therapy to keloid scar which may require coordination with onsite plastic surgery versus timing of excision at the [facility].   /es/ ELISA STEIN-LATA MD PLASTIC AND RECONSTRUCTIVE SURGERY Signed: 07/23/2023 10:35   SAFETY ISSUES: Prior radiation? No Pacemaker/ICD? No Possible current pregnancy? N/A Is the patient on methotrexate? No    BP (!) 144/105 (BP Location: Left Arm, Patient Position: Sitting) Comment: Rechecked  Pulse 77   Temp 97.9 F (36.6 C)   Resp 18   Ht 5\' 7"  (1.702 m)   Wt 224 lb 9.6 oz (101.9 kg)   SpO2 100%   BMI 35.18 kg/m

## 2023-10-09 ENCOUNTER — Ambulatory Visit
Admission: RE | Admit: 2023-10-09 | Discharge: 2023-10-09 | Disposition: A | Discharge status: Home / Self Care | Payer: Self-pay | Source: Ambulatory Visit | Attending: Radiation Oncology | Admitting: Radiation Oncology

## 2023-10-09 ENCOUNTER — Other Ambulatory Visit: Payer: Self-pay

## 2023-10-09 DIAGNOSIS — Z510 Encounter for antineoplastic radiation therapy: Secondary | ICD-10-CM | POA: Diagnosis present

## 2023-10-09 DIAGNOSIS — L910 Hypertrophic scar: Secondary | ICD-10-CM | POA: Insufficient documentation

## 2023-10-09 LAB — RAD ONC ARIA SESSION SUMMARY
Course Elapsed Days: 0
Plan Fractions Treated to Date: 1
Plan Prescribed Dose Per Fraction: 4 Gy
Plan Total Fractions Prescribed: 3
Plan Total Prescribed Dose: 12 Gy
Reference Point Dosage Given to Date: 4 Gy
Reference Point Session Dosage Given: 4 Gy
Session Number: 1

## 2023-10-10 ENCOUNTER — Other Ambulatory Visit: Payer: Self-pay

## 2023-10-10 ENCOUNTER — Ambulatory Visit
Admission: RE | Admit: 2023-10-10 | Discharge: 2023-10-10 | Disposition: A | Discharge status: Home / Self Care | Payer: Self-pay | Source: Ambulatory Visit | Attending: Radiation Oncology | Admitting: Radiation Oncology

## 2023-10-10 DIAGNOSIS — Z510 Encounter for antineoplastic radiation therapy: Secondary | ICD-10-CM | POA: Diagnosis not present

## 2023-10-10 LAB — RAD ONC ARIA SESSION SUMMARY
Course Elapsed Days: 1
Plan Fractions Treated to Date: 2
Plan Prescribed Dose Per Fraction: 4 Gy
Plan Total Fractions Prescribed: 3
Plan Total Prescribed Dose: 12 Gy
Reference Point Dosage Given to Date: 8 Gy
Reference Point Session Dosage Given: 4 Gy
Session Number: 2

## 2023-10-13 ENCOUNTER — Ambulatory Visit
Admission: RE | Admit: 2023-10-13 | Discharge: 2023-10-13 | Disposition: A | Discharge status: Home / Self Care | Payer: Self-pay | Source: Ambulatory Visit | Attending: Radiation Oncology | Admitting: Radiation Oncology

## 2023-10-13 ENCOUNTER — Other Ambulatory Visit: Payer: Self-pay

## 2023-10-13 ENCOUNTER — Ambulatory Visit
Admission: RE | Admit: 2023-10-13 | Discharge: 2023-10-13 | Disposition: A | Discharge status: Home / Self Care | Source: Ambulatory Visit | Attending: Radiation Oncology | Admitting: Radiation Oncology

## 2023-10-13 DIAGNOSIS — Z510 Encounter for antineoplastic radiation therapy: Secondary | ICD-10-CM | POA: Diagnosis not present

## 2023-10-13 LAB — RAD ONC ARIA SESSION SUMMARY
Course Elapsed Days: 4
Plan Fractions Treated to Date: 3
Plan Prescribed Dose Per Fraction: 4 Gy
Plan Total Fractions Prescribed: 3
Plan Total Prescribed Dose: 12 Gy
Reference Point Dosage Given to Date: 12 Gy
Reference Point Session Dosage Given: 4 Gy
Session Number: 3

## 2023-10-14 NOTE — Radiation Completion Notes (Addendum)
  Radiation Oncology         [phone] ________________________________  Name: Devin Price MRN: [id number]  Date of Service: 10/13/2023  DOB: [birth year]  End of Treatment Note  Diagnosis: Hypertrophic scar Intent: Curative     ==========DELIVERED PLANS==========  First Treatment Date: 2023-10-09 Last Treatment Date: 2023-10-13   Plan Name: [id number] Site: [place], Left Technique: Electron Mode: Electron Dose Per Fraction: 4 Gy Prescribed Dose (Delivered / Prescribed): 12 Gy / 12 Gy Prescribed Fxs (Delivered / Prescribed): 3 / 3     ====================================   The patient tolerated radiation well and denied any significant side effects throughout his treatment.   The patient will return in one month.     Ronita Due, PA-C

## 2023-11-05 NOTE — Addendum Note (Signed)
 Encounter addended by: Wyatt Leeroy HERO, PA-C on: 11/05/2023 9:58 AM  Actions taken: Clinical Note Signed

## 2023-11-12 ENCOUNTER — Encounter: Payer: Self-pay | Admitting: Radiation Oncology

## 2023-11-12 NOTE — Progress Notes (Signed)
  Radiation Oncology         [phone] ________________________________  Name: Devin Price MRN: [id number]  Date: 11/13/2023  DOB: 26-Jun-1965  Follow-Up Visit Note  CC: Patient, No Pcp Per  No ref. provider found  No diagnosis found.  Diagnosis:  Hypertrophic scar   Interval Since Last Radiation:  1 month 1 day  Intent: curative  First Treatment Date: 2023-10-09 Last Treatment Date: 2023-10-13   Plan Name: [id number] Site: Roni, Left Technique: Electron Mode: Electron Dose Per Fraction: 4 Gy Prescribed Dose (Delivered / Prescribed): 12 Gy / 12 Gy Prescribed Fxs (Delivered / Prescribed): 3 / 3   Narrative:  The patient returns today for routine follow-up. He was last seen in office on 08/13/23 for his consultation visit. Since then, patient completed his radiation treatment which he  tolerated quite well without experiencing any side effects.   Patient continued to follow up with their specialists to manage their chronic conditions.   No other significant oncologic interval history since the patient was last seen.                               Allergies:  is allergic to bee pollen.  Meds: Current Outpatient Medications  Medication Sig Dispense Refill   amLODipine (NORVASC) 10 MG tablet Take 10 mg by mouth as directed. Every other day     HYDROcodone -acetaminophen  (NORCO) 5-325 MG tablet Take 1-2 tablets by mouth every 6 (six) hours as needed. (Patient not taking: Reported on 08/13/2023) 20 tablet 0   naproxen  (NAPROSYN ) 500 MG tablet Take 1 tablet (500 mg total) by mouth 2 (two) times daily. 30 tablet 0   No current facility-administered medications for this encounter.    Physical Findings: The patient is in no acute distress. Patient is alert and oriented.  vitals were not taken for this visit. .  No significant changes. Lungs are clear to auscultation bilaterally. Heart has regular rate and rhythm. No palpable cervical, supraclavicular, or axillary adenopathy.  Abdomen soft, non-tender, normal bowel sounds.   Lab Findings: Lab Results  Component Value Date   WBC 12.1 (H) 11/07/2019   HGB 15.0 11/07/2019   HCT 44.5 11/07/2019   MCV 92.1 11/07/2019   PLT 226 11/07/2019    Radiographic Findings: No results found.  Impression: Hypertrophic scar   The patient is recovering from the effects of radiation.  ***  Plan:  ***   *** minutes of total time was spent for this patient encounter, including preparation, face-to-face counseling with the patient and coordination of care, physical exam, and documentation of the encounter. ____________________________________  Lynwood CHARM Nasuti, PhD, MD  This document serves as a record of services personally performed by Lynwood Nasuti, MD. It was created on his behalf by Reymundo Cartwright, a trained medical scribe. The creation of this record is based on the scribe's personal observations and the provider's statements to them. This document has been checked and approved by the attending provider.

## 2023-11-13 ENCOUNTER — Ambulatory Visit
Admission: RE | Admit: 2023-11-13 | Discharge: 2023-11-13 | Disposition: A | Discharge status: Home / Self Care | Source: Ambulatory Visit | Attending: Radiation Oncology | Admitting: Radiation Oncology

## 2023-11-13 ENCOUNTER — Encounter: Payer: Self-pay | Admitting: Radiation Oncology

## 2023-11-13 VITALS — BP 155/94 | Temp 97.7°F | Resp 18 | Ht 67.0 in | Wt 222.2 lb

## 2023-11-13 DIAGNOSIS — L910 Hypertrophic scar: Secondary | ICD-10-CM

## 2023-11-13 HISTORY — DX: Personal history of irradiation: Z92.3

## 2023-11-13 NOTE — Progress Notes (Signed)
 Devin Price is here today for follow up post radiation to the left pinna    They completed their radiation on: 10/13/23  Does the patient complain of any of the  Hearing Changes: No Skin Changes: No, area has healed well.  Fatigue:No   Additional comments if applicable:  BP (!) 155/94   Temp 97.7 F (36.5 C)   Resp 18   Ht 5' 7 (1.702 m)   Wt 222 lb 3.2 oz (100.8 kg)   SpO2 98%   BMI 34.80 kg/m

## 2024-03-15 ENCOUNTER — Encounter (HOSPITAL_BASED_OUTPATIENT_CLINIC_OR_DEPARTMENT_OTHER): Payer: Self-pay

## 2024-03-15 ENCOUNTER — Emergency Department (HOSPITAL_BASED_OUTPATIENT_CLINIC_OR_DEPARTMENT_OTHER)
Admission: EM | Admit: 2024-03-15 | Discharge: 2024-03-15 | Disposition: A | Discharge status: Home / Self Care | Attending: Emergency Medicine | Admitting: Emergency Medicine

## 2024-03-15 DIAGNOSIS — I10 Essential (primary) hypertension: Secondary | ICD-10-CM | POA: Insufficient documentation

## 2024-03-15 DIAGNOSIS — J029 Acute pharyngitis, unspecified: Secondary | ICD-10-CM | POA: Insufficient documentation

## 2024-03-15 DIAGNOSIS — R0981 Nasal congestion: Secondary | ICD-10-CM | POA: Diagnosis not present

## 2024-03-15 DIAGNOSIS — Z79899 Other long term (current) drug therapy: Secondary | ICD-10-CM | POA: Diagnosis not present

## 2024-03-15 LAB — RESP PANEL BY RT-PCR (RSV, FLU A&B, COVID)  RVPGX2
Influenza A by PCR: NEGATIVE
Influenza B by PCR: NEGATIVE
Resp Syncytial Virus by PCR: NEGATIVE
SARS Coronavirus 2 by RT PCR: NEGATIVE

## 2024-03-15 LAB — GROUP A STREP BY PCR: Group A Strep by PCR: NOT DETECTED

## 2024-03-15 MED ORDER — HYDROCODONE BIT-HOMATROP MBR 5-1.5 MG/5ML PO SOLN: 5.0000 mL | Freq: Four times a day (QID) | ORAL | 0 refills | Status: AC | PRN

## 2024-03-15 MED ORDER — DEXAMETHASONE 4 MG PO TABS
10.0000 mg | ORAL_TABLET | Freq: Once | ORAL | Status: AC
  Administered 2024-03-15: 10 mg via ORAL
  Filled 2024-03-15: qty 3

## 2024-03-15 NOTE — ED Triage Notes (Signed)
 Pt to triage c/o sore throat cough x 1 days. Pr denies fever chill SOB CP. VSS NAD PT on room air.

## 2024-03-16 NOTE — ED Provider Notes (Signed)
 Emergency Department Provider Note  TRIAGE NOTE: Pt to triage c/o sore throat cough x 1 days. Pr denies fever chill SOB CP. VSS NAD PT on room air.   HISTORY  Chief Complaint Sore Throat   HPI Devin Price is a 58 y.o. male with   sore throat and congestion that began yesterday. He reports no fever and has not taken any medications at home. The patient has been exposed to sick contacts, including his wife and granddaughter, the latter having had RSV two weeks ago. He denies smoking and reports no sick contacts at work. The patient has a history of hypertension managed with Amlodipine. He denies any other medical problems or medication use. The patient was last seen in the emergency department last week when his wife was evaluated for similar symptoms, and all tests returned negative. History was obtained from the patient.  PMH Past Medical History:  Diagnosis Date   History of radiation therapy    Left Pinna-10/09/23-10/13/23-Dr.James Kinard    Home Medications Prior to Admission medications   Medication Sig Start Date End Date Taking? Authorizing Provider  HYDROcodone  bit-homatropine (HYCODAN) 5-1.5 MG/5ML syrup Take 5 mLs by mouth every 6 (six) hours as needed. 03/15/24  Yes Miami Latulippe, MD  amLODipine (NORVASC) 10 MG tablet Take 10 mg by mouth as directed. Every other day 08/16/22   [provider]  HYDROcodone -acetaminophen  (NORCO) 5-325 MG tablet Take 1-2 tablets by mouth every 6 (six) hours as needed. Patient not taking: Reported on 08/13/2023 11/07/19   Geroldine Berg, MD  naproxen  (NAPROSYN ) 500 MG tablet Take 1 tablet (500 mg total) by mouth 2 (two) times daily. Patient not taking: Reported on 11/13/2023 04/08/23   Theadore Ozell HERO, MD    Social History Social History   Tobacco Use   Smoking status: Never  Substance Use Topics   Alcohol use: Yes    Alcohol/week: 2.0 standard drinks of alcohol    Types: 1 Glasses of wine, 1 Shots of liquor per week   Drug  use: No    Review of Systems: Documented in HPI ____________________________________________  PHYSICAL EXAM: VITAL SIGNS: Triage: Blood pressure (!) 164/83, pulse 81, temperature 98.4 F (36.9 C), temperature source Oral, resp. rate 16, height 5' 7 (1.702 m), weight 99.8 kg, SpO2 98%.  Vitals:   03/15/24 0532 03/15/24 0534 03/15/24 0700 03/15/24 0715  BP: (!) 155/102  (!) 164/83   Pulse: 83  87 81  Resp: 17  16   Temp: 98.4 F (36.9 C)     TempSrc: Oral     SpO2: 98%  96% 98%  Weight:  99.8 kg    Height:  5' 7 (1.702 m)      Physical Exam Vitals and nursing note reviewed.  Constitutional:      Appearance: He is well-developed.  HENT:     Head: Normocephalic and atraumatic.     Mouth/Throat:     Pharynx: Posterior oropharyngeal erythema present.  Cardiovascular:     Rate and Rhythm: Normal rate.  Pulmonary:     Effort: Pulmonary effort is normal. No respiratory distress.  Abdominal:     General: There is no distension.  Musculoskeletal:        General: Normal range of motion.     Cervical back: Normal range of motion.  Neurological:     Mental Status: He is alert.       ____________________________________________   LABS (all labs ordered are listed, but only abnormal results are displayed)  Labs Reviewed  RESP PANEL BY RT-PCR (RSV, FLU A&B, COVID)  [id number]  GROUP A STREP BY PCR   ____________________________________________  EKG   EKG Interpretation Date/Time:    Ventricular Rate:    PR Interval:    QRS Duration:    QT Interval:    QTC Calculation:   R Axis:      Text Interpretation:          ____________________________________________  RADIOLOGY  No results found. ____________________________________________  PROCEDURES  Procedure(s) performed:   Procedures ____________________________________________  INITIAL IMPRESSION / ASSESSMENT AND PLAN       Images ordered viewed and obtained by myself. Agree with Radiology  interpretation. Details in ED course.  Labs ordered reviewed by myself as detailed in ED course.  Consultations obtained/considered detailed in ED course.    CRITICAL INTERVENTIONS:  N/a  The patient presents with a sore throat and congestion that began yesterday, following exposure to family members with similar symptoms, including a granddaughter who had RSV two weeks ago. The patient denies fever and has not taken any medications at home. The patient has a history of hypertension managed with Amlodipine. Physical examination reveals a mild redness in the throat without exudates, pus, or swelling, and lung sounds are clear, suggesting no bronchitis or bacterial infection.   The differential diagnosis includes viral upper respiratory infection, allergic rhinitis, sinusitis, bacterial pharyngitis, and influenza. Given the absence of fever, exudates, and the patient's recent exposure to viral illness, a viral upper respiratory infection is most likely. The workup included a review of recent negative tests for COVID-19, flu, RSV, and strep throat, supporting a viral etiology.  The diagnosis is a viral upper respiratory infection, likely exacerbated by postnasal drip. The plan is for symptomatic treatment, including rest, hydration, and nasal rinses. The patient is advised against using Afrin due to hypertension. A prescription for cough medicine containing pain medication is provided for symptomatic relief, with instructions to avoid sharing it, especially with children, due to its controlled substance status. The patient is advised to stay home for a few days and return if symptoms significantly worsen. A note for work absence until Thursday is provided, with the option to return earlier if symptoms improve.     FINAL IMPRESSION Final diagnoses:  Sore throat     Disposition A medical screening exam was performed and I feel the patient has had an appropriate workup for their chief complaint at  this time and likelihood of emergent condition existing is low. They have been counseled on decision, DISCHARGE, follow up and which symptoms necessitate immediate return to the emergency department. They or their family verbally stated understanding and agreement with plan and discharged in stable condition.   ____________________________________________   NEW OUTPATIENT MEDICATIONS STARTED DURING THIS VISIT:  Discharge Medication List as of 03/15/2024  7:09 AM     START taking these medications   Details  HYDROcodone  bit-homatropine (HYCODAN) 5-1.5 MG/5ML syrup Take 5 mLs by mouth every 6 (six) hours as needed., Starting Mon 03/15/2024, Normal        Note:  This note was prepared with assistance of Dragon voice recognition software. Occasional wrong-word or sound-a-like substitutions may have occurred due to the inherent limitations of voice recognition software.    Ima Hafner, Selinda, MD 03/16/24 [phone]
